# Patient Record
Sex: Female | Born: 1995 | Race: Black or African American | Hispanic: No | Marital: Single | Smoking: Current some day smoker
Health system: Southern US, Community
[De-identification: ages and names within clinical notes are randomized; demographics above are authoritative.]

## PROBLEM LIST (undated history)

## (undated) DIAGNOSIS — F319 Bipolar disorder, unspecified: Secondary | ICD-10-CM

## (undated) DIAGNOSIS — F419 Anxiety disorder, unspecified: Secondary | ICD-10-CM

## (undated) DIAGNOSIS — F32A Depression, unspecified: Secondary | ICD-10-CM

## (undated) DIAGNOSIS — F909 Attention-deficit hyperactivity disorder, unspecified type: Secondary | ICD-10-CM

## (undated) DIAGNOSIS — J45909 Unspecified asthma, uncomplicated: Secondary | ICD-10-CM

## (undated) DIAGNOSIS — F329 Major depressive disorder, single episode, unspecified: Secondary | ICD-10-CM

## (undated) DIAGNOSIS — F4310 Post-traumatic stress disorder, unspecified: Secondary | ICD-10-CM

---

## 2013-10-05 ENCOUNTER — Emergency Department (HOSPITAL_COMMUNITY): Payer: Medicaid Other

## 2013-10-05 ENCOUNTER — Encounter (HOSPITAL_COMMUNITY): Payer: Self-pay | Admitting: Emergency Medicine

## 2013-10-05 ENCOUNTER — Emergency Department (HOSPITAL_COMMUNITY)
Admission: EM | Admit: 2013-10-05 | Discharge: 2013-10-05 | Disposition: A | Discharge status: Home / Self Care | Payer: Medicaid Other | Attending: Emergency Medicine | Admitting: Emergency Medicine

## 2013-10-05 DIAGNOSIS — Y92410 Unspecified street and highway as the place of occurrence of the external cause: Secondary | ICD-10-CM | POA: Insufficient documentation

## 2013-10-05 DIAGNOSIS — R109 Unspecified abdominal pain: Secondary | ICD-10-CM | POA: Diagnosis not present

## 2013-10-05 DIAGNOSIS — Z8659 Personal history of other mental and behavioral disorders: Secondary | ICD-10-CM | POA: Insufficient documentation

## 2013-10-05 DIAGNOSIS — IMO0002 Reserved for concepts with insufficient information to code with codable children: Secondary | ICD-10-CM | POA: Insufficient documentation

## 2013-10-05 DIAGNOSIS — R42 Dizziness and giddiness: Secondary | ICD-10-CM | POA: Diagnosis not present

## 2013-10-05 DIAGNOSIS — S199XXA Unspecified injury of neck, initial encounter: Secondary | ICD-10-CM | POA: Diagnosis not present

## 2013-10-05 DIAGNOSIS — M549 Dorsalgia, unspecified: Secondary | ICD-10-CM

## 2013-10-05 DIAGNOSIS — M542 Cervicalgia: Secondary | ICD-10-CM

## 2013-10-05 DIAGNOSIS — Z3202 Encounter for pregnancy test, result negative: Secondary | ICD-10-CM | POA: Diagnosis not present

## 2013-10-05 DIAGNOSIS — Y9389 Activity, other specified: Secondary | ICD-10-CM | POA: Diagnosis not present

## 2013-10-05 DIAGNOSIS — J45909 Unspecified asthma, uncomplicated: Secondary | ICD-10-CM | POA: Diagnosis not present

## 2013-10-05 DIAGNOSIS — S0993XA Unspecified injury of face, initial encounter: Secondary | ICD-10-CM | POA: Diagnosis not present

## 2013-10-05 DIAGNOSIS — R064 Hyperventilation: Secondary | ICD-10-CM | POA: Diagnosis not present

## 2013-10-05 HISTORY — DX: Depression, unspecified: F32.A

## 2013-10-05 HISTORY — DX: Anxiety disorder, unspecified: F41.9

## 2013-10-05 HISTORY — DX: Bipolar disorder, unspecified: F31.9

## 2013-10-05 HISTORY — DX: Post-traumatic stress disorder, unspecified: F43.10

## 2013-10-05 HISTORY — DX: Unspecified asthma, uncomplicated: J45.909

## 2013-10-05 HISTORY — DX: Major depressive disorder, single episode, unspecified: F32.9

## 2013-10-05 HISTORY — DX: Attention-deficit hyperactivity disorder, unspecified type: F90.9

## 2013-10-05 LAB — COMPREHENSIVE METABOLIC PANEL
ALBUMIN: 4.2 g/dL (ref 3.5–5.2)
ALT: 11 U/L (ref 0–35)
AST: 16 U/L (ref 0–37)
Alkaline Phosphatase: 87 U/L (ref 39–117)
BUN: 4 mg/dL — ABNORMAL LOW (ref 6–23)
CO2: 19 mEq/L (ref 19–32)
CREATININE: 0.76 mg/dL (ref 0.50–1.10)
Calcium: 9.6 mg/dL (ref 8.4–10.5)
Chloride: 102 mEq/L (ref 96–112)
GFR calc Af Amer: >90 mL/min (ref >90)
GFR calc non Af Amer: >90 mL/min (ref >90)
Glucose, Bld: 86 mg/dL (ref 70–99)
Potassium: 3.5 mEq/L — ABNORMAL LOW (ref 3.7–5.3)
SODIUM: 142 meq/L (ref 137–147)
Total Bilirubin: 0.3 mg/dL (ref 0.3–1.2)
Total Protein: 7.8 g/dL (ref 6.0–8.3)

## 2013-10-05 LAB — CBC WITH DIFFERENTIAL/PLATELET
BASOS ABS: 0 10*3/uL (ref 0.0–0.1)
BASOS PCT: 0 % (ref 0–1)
Eosinophils Absolute: 0 10*3/uL (ref 0.0–0.7)
Eosinophils Relative: 0 % (ref 0–5)
HEMATOCRIT: 39.4 % (ref 36.0–46.0)
Hemoglobin: 13.5 g/dL (ref 12.0–15.0)
Lymphocytes Relative: 27 % (ref 12–46)
Lymphs Abs: 3.3 10*3/uL (ref 0.7–4.0)
MCH: 30.4 pg (ref 26.0–34.0)
MCHC: 34.3 g/dL (ref 30.0–36.0)
MCV: 88.7 fL (ref 78.0–100.0)
Monocytes Absolute: 0.7 10*3/uL (ref 0.1–1.0)
Monocytes Relative: 5 % (ref 3–12)
NEUTROS ABS: 8.2 10*3/uL — AB (ref 1.7–7.7)
Neutrophils Relative %: 67 % (ref 43–77)
PLATELETS: 284 10*3/uL (ref 150–400)
RBC: 4.44 MIL/uL (ref 3.87–5.11)
RDW: 13 % (ref 11.5–15.5)
WBC: 12.2 10*3/uL — AB (ref 4.0–10.5)

## 2013-10-05 LAB — POC URINE PREG, ED: PREG TEST UR: NEGATIVE

## 2013-10-05 LAB — URINALYSIS, ROUTINE W REFLEX MICROSCOPIC
Bilirubin Urine: NEGATIVE
Glucose, UA: NEGATIVE mg/dL
Hgb urine dipstick: NEGATIVE
Ketones, ur: NEGATIVE mg/dL
Nitrite: NEGATIVE
PH: 7 (ref 5.0–8.0)
Protein, ur: NEGATIVE mg/dL
Specific Gravity, Urine: 1.003 — ABNORMAL LOW (ref 1.005–1.030)
Urobilinogen, UA: 0.2 mg/dL (ref 0.0–1.0)

## 2013-10-05 LAB — URINE MICROSCOPIC-ADD ON

## 2013-10-05 MED ORDER — TRAMADOL HCL 50 MG PO TABS: 50.0000 mg | ORAL_TABLET | Freq: Four times a day (QID) | ORAL | Status: DC | PRN

## 2013-10-05 MED ORDER — IOHEXOL 300 MG/ML  SOLN
100.0000 mL | Freq: Once | INTRAMUSCULAR | Status: AC | PRN
  Administered 2013-10-05: 100 mL via INTRAVENOUS

## 2013-10-05 MED ORDER — TRAMADOL HCL 50 MG PO TABS: 50.0000 mg | ORAL_TABLET | Freq: Four times a day (QID) | ORAL | Status: AC | PRN
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
## 2013-10-05 NOTE — ED Notes (Signed)
Pt ambulated to BR and back without difficulty; pt with stable gait and steady on her feet

## 2013-10-05 NOTE — ED Notes (Signed)
Patient transported to CT 
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
## 2013-10-08 ENCOUNTER — Emergency Department (HOSPITAL_COMMUNITY)
Admission: EM | Admit: 2013-10-08 | Discharge: 2013-10-08 | Discharge status: Absent without leave | Payer: Self-pay | Source: Home / Self Care

## 2013-10-08 NOTE — ED Notes (Signed)
Called, na x3

## 2013-10-08 NOTE — ED Notes (Signed)
No answer x2 in lobby  

## 2013-10-08 NOTE — ED Notes (Signed)
No answer in waiting room 
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
